# Patient Record
Sex: Male | Born: 1955 | Hispanic: Yes | Marital: Married | Smoking: Never smoker
Health system: Southern US, Community
[De-identification: ages and names within clinical notes are randomized; demographics above are authoritative.]

---

## 2012-11-18 ENCOUNTER — Emergency Department (HOSPITAL_COMMUNITY): Payer: Self-pay

## 2012-11-18 ENCOUNTER — Emergency Department (HOSPITAL_COMMUNITY)
Admission: EM | Admit: 2012-11-18 | Discharge: 2012-11-18 | Disposition: A | Discharge status: Home / Self Care | Payer: Self-pay | Attending: Emergency Medicine | Admitting: Emergency Medicine

## 2012-11-18 ENCOUNTER — Encounter (HOSPITAL_COMMUNITY): Payer: Self-pay | Admitting: *Deleted

## 2012-11-18 DIAGNOSIS — R509 Fever, unspecified: Secondary | ICD-10-CM | POA: Insufficient documentation

## 2012-11-18 DIAGNOSIS — M545 Low back pain, unspecified: Secondary | ICD-10-CM | POA: Insufficient documentation

## 2012-11-18 DIAGNOSIS — R21 Rash and other nonspecific skin eruption: Secondary | ICD-10-CM | POA: Insufficient documentation

## 2012-11-18 DIAGNOSIS — L259 Unspecified contact dermatitis, unspecified cause: Secondary | ICD-10-CM | POA: Insufficient documentation

## 2012-11-18 LAB — CBC WITH DIFFERENTIAL/PLATELET
Basophils Absolute: 0 10*3/uL (ref 0.0–0.1)
Eosinophils Absolute: 0.3 10*3/uL (ref 0.0–0.7)
Eosinophils Relative: 6 % — ABNORMAL HIGH (ref 0–5)
Lymphocytes Relative: 31 % (ref 12–46)
Lymphs Abs: 1.3 10*3/uL (ref 0.7–4.0)
MCV: 94.8 fL (ref 78.0–100.0)
Neutrophils Relative %: 49 % (ref 43–77)
Platelets: 183 10*3/uL (ref 150–400)
RBC: 4.42 MIL/uL (ref 4.22–5.81)
RDW: 13.3 % (ref 11.5–15.5)
WBC: 4.2 10*3/uL (ref 4.0–10.5)

## 2012-11-18 LAB — BASIC METABOLIC PANEL
CO2: 28 mEq/L (ref 19–32)
Calcium: 9.5 mg/dL (ref 8.4–10.5)
GFR calc non Af Amer: >90 mL/min (ref >90)
Glucose, Bld: 104 mg/dL — ABNORMAL HIGH (ref 70–99)
Potassium: 4.3 mEq/L (ref 3.5–5.1)
Sodium: 136 mEq/L (ref 135–145)

## 2012-11-18 MED ORDER — DEXAMETHASONE SODIUM PHOSPHATE 10 MG/ML IJ SOLN
10.0000 mg | Freq: Once | INTRAMUSCULAR | Status: AC
  Administered 2012-11-18: 10 mg via INTRAMUSCULAR
  Filled 2012-11-18: qty 1

## 2012-11-18 MED ORDER — OXYCODONE-ACETAMINOPHEN 5-325 MG PO TABS: ORAL_TABLET | ORAL | Status: DC

## 2012-11-18 NOTE — ED Notes (Signed)
Pt does not speak english, only spanish, pts daughter in law interpreting. Pt reports that on Thursday he was putting up sheet rock and started to have right hip pain that radiates down to his foot. intermittent numbness in right leg. Pain 8/10. On Friday pt was putting in insulation that got on his arms. At present pt has rash with wheals on left upper arm and rash noted on right calf.

## 2012-11-18 NOTE — ED Provider Notes (Signed)
This was a shared encounter.  This patient presents with 2 complaints.  The initial complaint of rash is likely due to the patient's exposure to fiberglass insulation.  The patient's initial concern of right lower extremity pain, hip pain is consistent with radiculopathy.  There is no neurologic deficit, no evidence of distress, or ongoing systemic illness.  He was discharged in stable condition.  Gerhard Munch, MD 11/18/12 [phone]

## 2012-11-18 NOTE — ED Provider Notes (Signed)
History     CSN: [id number]  Arrival date & time 11/18/12  1310   First MD Initiated Contact with Patient 11/18/12 1502      Chief Complaint  Patient presents with  . Leg Pain  . Rash    (Consider location/radiation/quality/duration/timing/severity/associated sxs/prior treatment) The history is provided by the patient and a relative. The history is limited by a language barrier.    Izaya Netherton is a 56 y.o. male complaining of right hip pain that radiates down to the ankle paresthesias to the right leg. Pain is severe 8/10 and he is walking with an antalgic gait. Patient also reports a rash to the left forearm and right calf that is described as both painful and burning and pleuritic as well. Patient also reports subjective fever. He denies any cough, chest pain, abdominal pain, nausea vomiting, change in bowel or bladder habits.   History reviewed. No pertinent past medical history.  History reviewed. No pertinent past surgical history.  No family history on file.  History  Substance Use Topics  . Smoking status: Never Smoker   . Smokeless tobacco: Not on file  . Alcohol Use: Yes      Review of Systems  Constitutional: Positive for fever.  Respiratory: Negative for shortness of breath.   Cardiovascular: Negative for chest pain.  Gastrointestinal: Negative for nausea, vomiting, abdominal pain and diarrhea.  Musculoskeletal: Positive for back pain.  Skin: Positive for rash.  All other systems reviewed and are negative.    Allergies  Review of patient's allergies indicates no known allergies.  Home Medications   Current Outpatient Rx  Name  Route  Sig  Dispense  Refill  . ACETAMINOPHEN 500 MG PO TABS   Oral   Take 500 mg by mouth every 6 (six) hours as needed. pain         . NAPROXEN SODIUM 220 MG PO TABS   Oral   Take 220 mg by mouth 2 (two) times daily with a meal. pain           BP 149/82  Pulse 96  Temp 99.2 F (37.3 C) (Oral)  Resp 16   SpO2 99%  Physical Exam  Nursing note and vitals reviewed. Constitutional: He is oriented to person, place, and time. He appears well-developed and well-nourished. No distress.  HENT:  Head: Normocephalic.  Eyes: Conjunctivae normal and EOM are normal. Pupils are equal, round, and reactive to light.  Cardiovascular: Normal rate.   Pulmonary/Chest: Effort normal and breath sounds normal. No stridor. No respiratory distress. He has no wheezes. He has no rales. He exhibits no tenderness.  Abdominal: Soft. Bowel sounds are normal. He exhibits no distension and no mass. There is no rebound.  Musculoskeletal: Normal range of motion.       Patient ambulates with an antalgic gait, full range of motion to left hip, mild tenderness to palpation of the greater trochanter  Neurological: He is alert and oriented to person, place, and time.  Skin:       Blistering erythematous rash to left forearm and right calf. Mild excoriation no warmth, induration, discharge or other signs of infection.  Psychiatric: He has a normal mood and affect.    ED Course  Procedures (including critical care time)  Labs Reviewed - No data to display No results found.   1. Low back pain radiating to right leg   2. Contact dermatitis       MDM  Patient with low-grade fever, rash and hip  pain. X-ray shows no bony abnormalities, blood work is normal, this is a shared visit with attending Dr. Jeraldine Loots.  Patient will be given 10 mg of Decadron IM this should help with both the hip pain and the rash.   Pt verbalized understanding and agrees with care plan. Outpatient follow-up and return precautions given.    New Prescriptions   OXYCODONE-ACETAMINOPHEN (PERCOCET/ROXICET) 5-325 MG PER TABLET    1 to 2 tabs PO q6hrs  PRN for pain          Wynetta Emery, PA-C 11/18/12 1733

## 2012-11-18 NOTE — ED Notes (Signed)
Pt escorted to discharge window. Pt verbalized understanding discharge instructions. In no acute distress.  

## 2014-01-02 IMAGING — CR DG HIP COMPLETE 2+V*R*
3 series · 3 of 3 positions shown · non-contrast
Comparison: None.

CLINICAL DATA: Pain with rash.

RIGHT HIP - COMPLETE 2+ VIEW

[t pelvis ap]
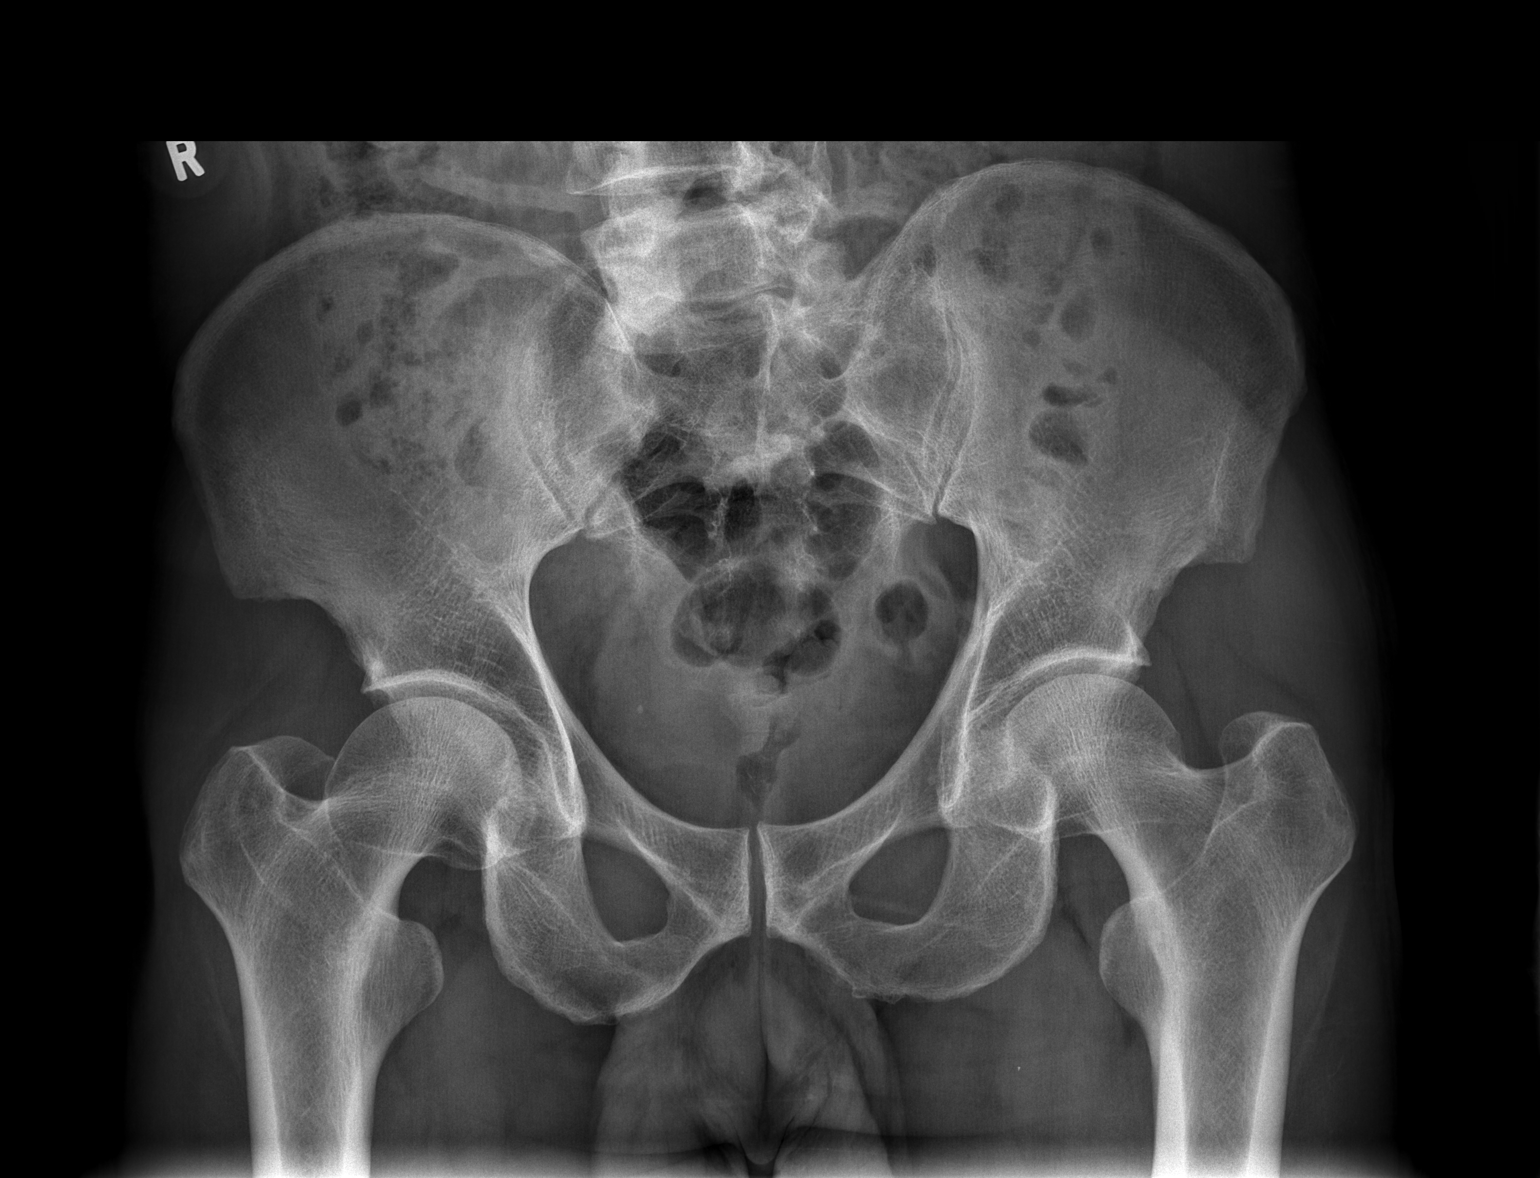

[t hip ap right]
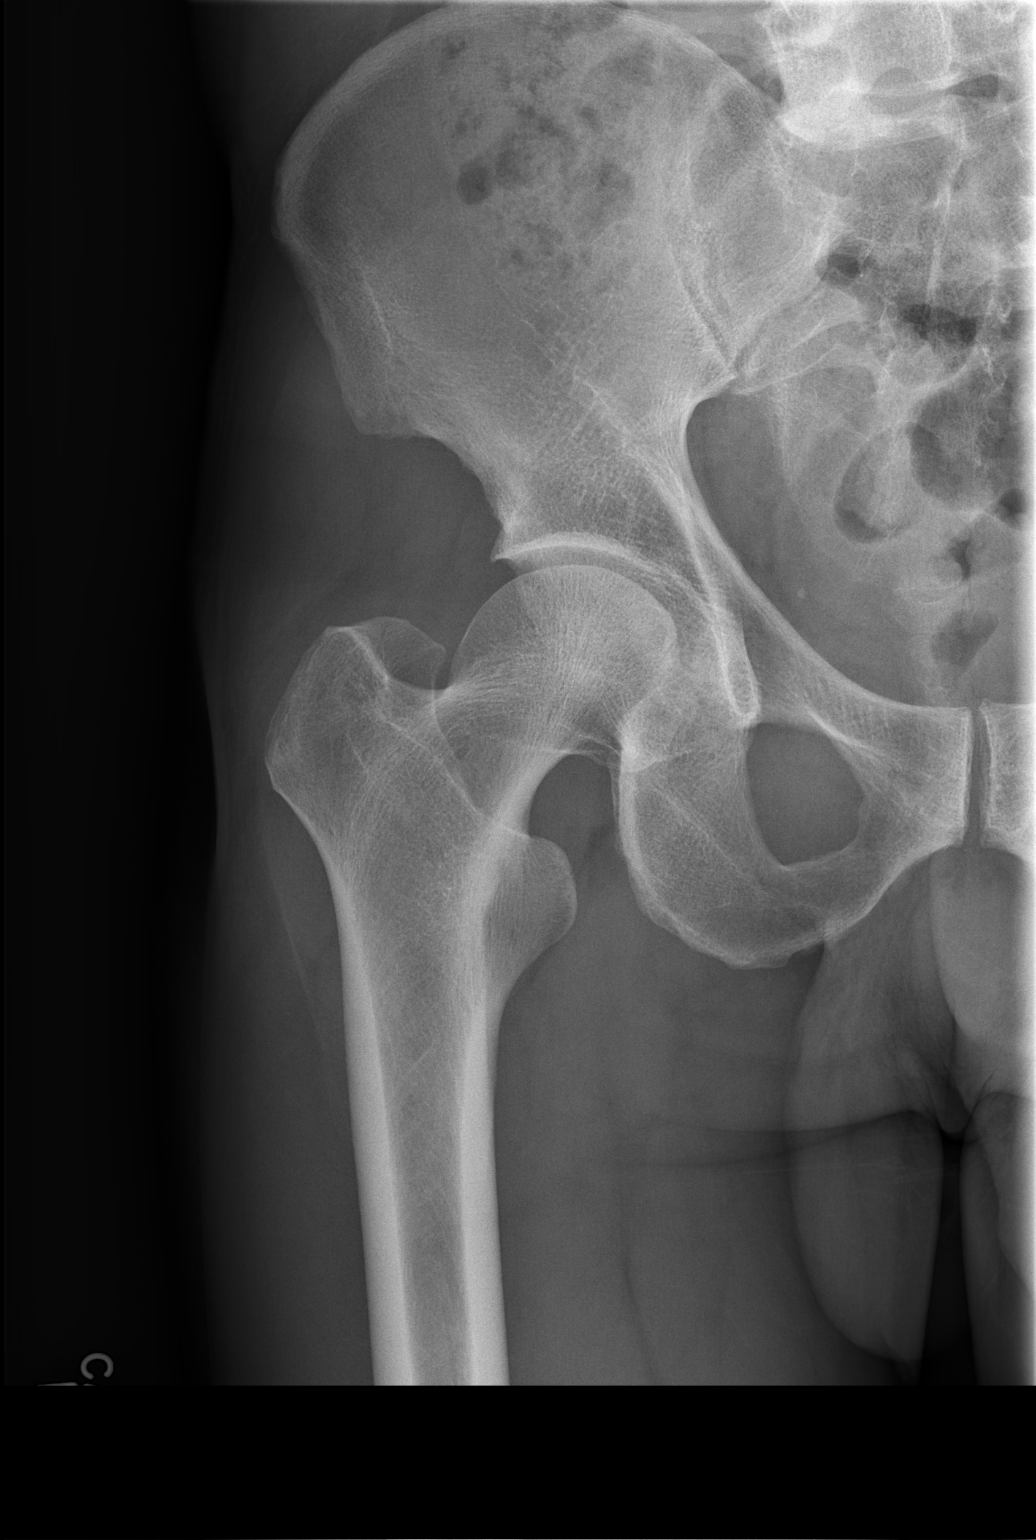

[t hip frog leg right]
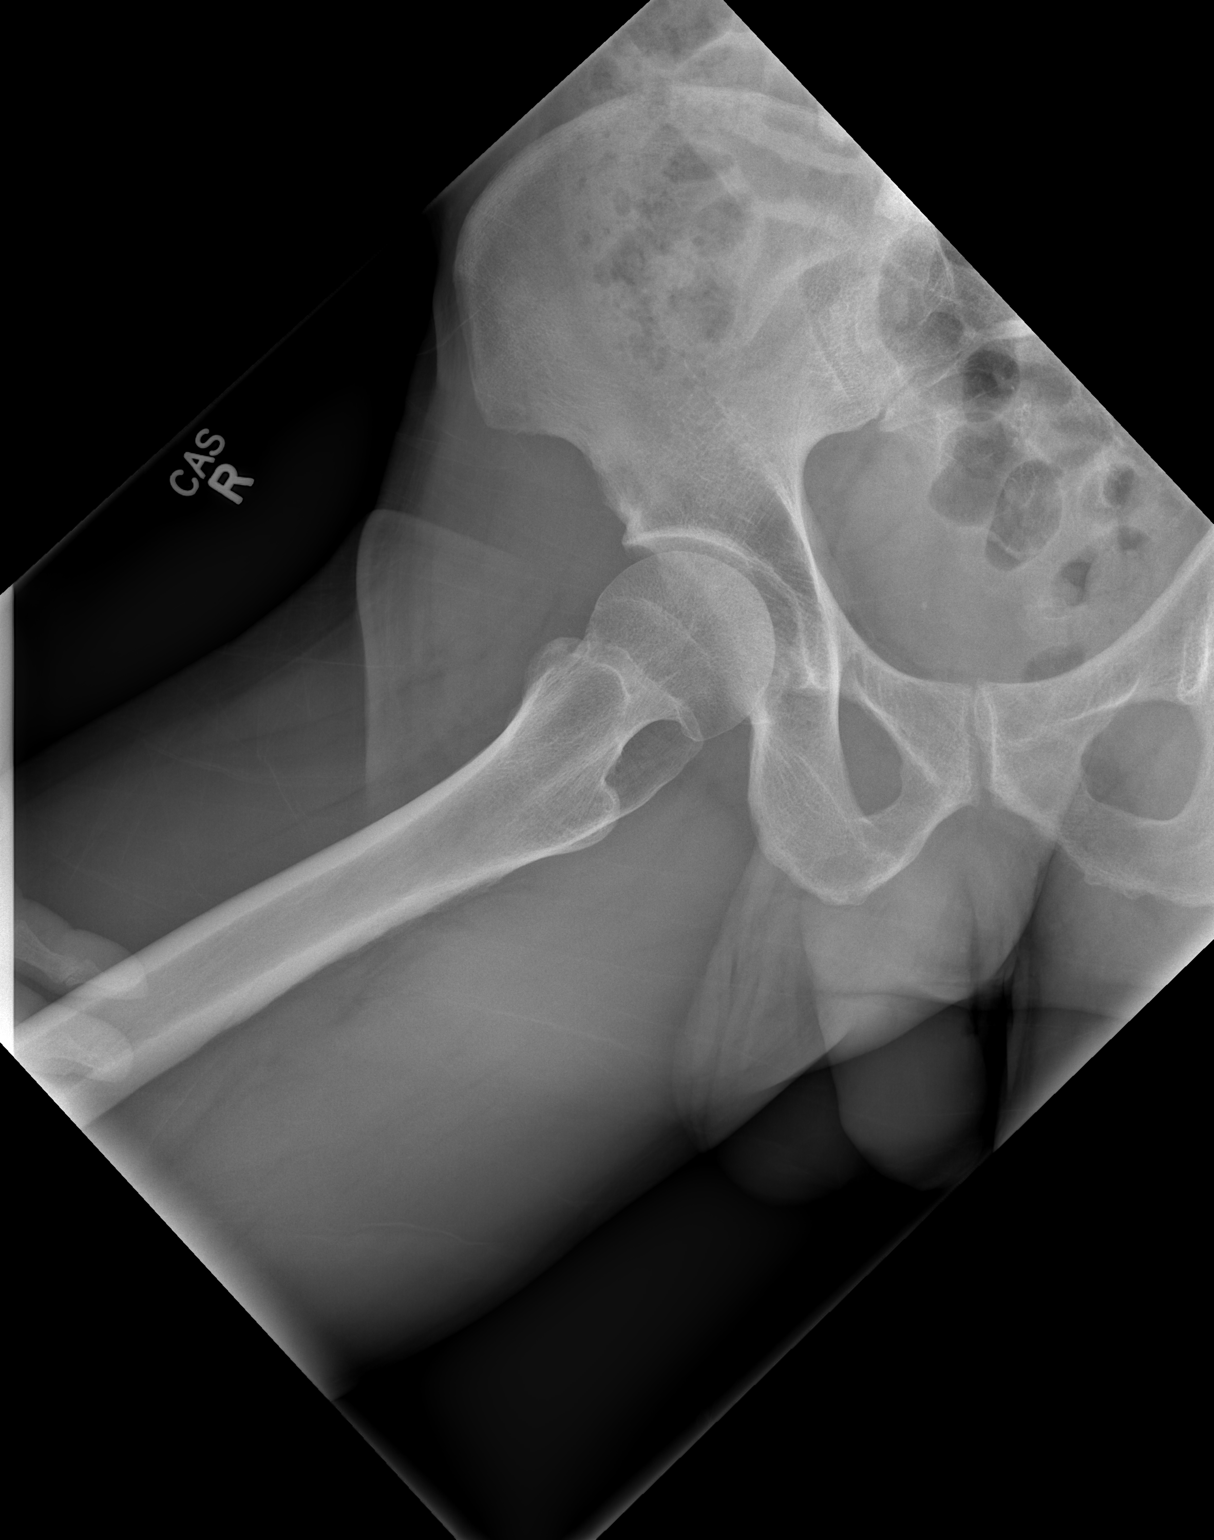

[3 of 3 positions shown; findings below may reference images not displayed]

FINDINGS: There is no evidence of hip fracture or dislocation.
There is no evidence of arthropathy or other focal bone
abnormality.
IMPRESSION: Negative.

## 2016-01-17 ENCOUNTER — Ambulatory Visit: Payer: Self-pay | Admitting: Physician Assistant

## 2016-01-17 ENCOUNTER — Ambulatory Visit: Payer: 59

## 2016-01-17 MED ORDER — GUAIFENESIN ER 1200 MG PO TB12: 1.0000 | ORAL_TABLET | Freq: Two times a day (BID) | ORAL | Status: DC

## 2016-01-17 MED ORDER — OSELTAMIVIR PHOSPHATE 75 MG PO CAPS: 75.0000 mg | ORAL_CAPSULE | Freq: Two times a day (BID) | ORAL | Status: DC

## 2016-01-17 MED ORDER — ACETAMINOPHEN 325 MG PO TABS
1000.0000 mg | ORAL_TABLET | Freq: Once | ORAL | Status: AC
  Administered 2016-01-17: 975 mg via ORAL

## 2016-01-17 MED ORDER — HYDROCODONE-HOMATROPINE 5-1.5 MG/5ML PO SYRP: 5.0000 mL | ORAL_SOLUTION | Freq: Three times a day (TID) | ORAL | Status: DC | PRN

## 2016-01-17 NOTE — Progress Notes (Signed)
Charted in incorrect patient's chart.

## 2016-01-17 NOTE — Addendum Note (Signed)
Addended by: Morrell Riddle on: 01/17/2016 02:14 PM   Modules accepted: Orders, SmartSet

## 2016-01-17 NOTE — Addendum Note (Signed)
Addended by: Morrell Riddle on: 01/17/2016 02:08 PM   Modules accepted: Kipp Brood

## 2016-01-17 NOTE — Progress Notes (Deleted)
   Chad Caldwell  MRN: [id number] DOB: 1956-02-28  Subjective:  Pt presents to clinic with flu like symptoms that started yesterday.  He has congestion and cough with yellow mucus with both.  He has headaches and myalgias but no abd symptoms.  Home treatment - none No flu vaccine this year Sick contact - son with flu last week  Daughter interpreted the visit  There are no active problems to display for this patient.   No current outpatient prescriptions on file prior to visit.   No current facility-administered medications on file prior to visit.    No Known Allergies  Review of Systems  Constitutional: Positive for fever and chills.  HENT: Positive for congestion, postnasal drip, rhinorrhea (yellow) and sore throat.   Respiratory: Positive for cough (yellow).   Gastrointestinal: Negative for nausea, vomiting and diarrhea.  Musculoskeletal: Positive for myalgias.  Neurological: Positive for headaches.   Objective:  BP 120/82 mmHg  Pulse 106  Temp(Src) 102.6 F (39.2 C) (Oral)  Resp 18  Ht  (1.727 m)  Wt 233 lb 3.2 oz (105.779 kg)  BMI 35.47 kg/m2  SpO2 98%  Physical Exam  Constitutional: He is oriented to person, place, and time and well-developed, well-nourished, and in no distress.     HENT:  Head: Normocephalic and atraumatic.  Right Ear: Hearing, tympanic membrane, external ear and ear canal normal.  Left Ear: Hearing, tympanic membrane, external ear and ear canal normal.  Nose: Nose normal.  Mouth/Throat: Uvula is midline, oropharynx is clear and moist and mucous membranes are normal.  Eyes: Conjunctivae are normal.  Neck: Normal range of motion.  Cardiovascular: Normal rate, regular rhythm and normal heart sounds.   Pulmonary/Chest: Effort normal and breath sounds normal. He has no wheezes.  Lymphadenopathy:       Head (right side): No tonsillar adenopathy present.       Head (left side): No tonsillar adenopathy present.    He has no cervical  adenopathy.       Right: No supraclavicular adenopathy present.       Left: No supraclavicular adenopathy present.  Neurological: He is alert and oriented to person, place, and time. Gait normal.  Skin: Skin is warm and dry.  Psychiatric: Mood, memory, affect and judgment normal.    Assessment and Plan :  Flu-like symptoms - Plan: HYDROcodone-homatropine (HYCODAN) 5-1.5 MG/5ML syrup, Guaifenesin (MUCINEX MAXIMUM STRENGTH) 1200 MG TB12, oseltamivir (TAMIFLU) 75 MG capsule, Care order/instruction  Other specified fever - Plan: acetaminophen (TYLENOL) tablet 975 mg   Son was diagnosed with the flu last week and with his current symptoms I offered him flu medications.  We discussed symptomatic treatment for him to do along with the tamiflu.  Benny Lennert PA-C  [facility] 01/17/2016 1:14 PM

## 2016-01-17 NOTE — Patient Instructions (Signed)
Please push fluids.  Tylenol and Motrin for fever and body aches.    A humidifier can help especially when the air is dry -if you do not have a humidifier you can boil a pot of water on the stove in your home to help with the dry air.  

## 2016-01-28 NOTE — Progress Notes (Signed)
This encounter was created in error - please disregard.

## 2016-01-28 NOTE — Addendum Note (Signed)
Addended by: Morrell Riddle on: 01/28/2016 08:09 AM   Modules accepted: Level of Service, SmartSet
